# Patient Record
Sex: Male | Born: 1955 | Race: White | Hispanic: No | Marital: Married | Smoking: Current every day smoker
Health system: Southern US, Community
[De-identification: ages and names within clinical notes are randomized; demographics above are authoritative.]

## PROBLEM LIST (undated history)

## (undated) DIAGNOSIS — E079 Disorder of thyroid, unspecified: Secondary | ICD-10-CM

## (undated) DIAGNOSIS — H3322 Serous retinal detachment, left eye: Secondary | ICD-10-CM

## (undated) DIAGNOSIS — IMO0002 Reserved for concepts with insufficient information to code with codable children: Secondary | ICD-10-CM

## (undated) HISTORY — PX: HERNIA REPAIR: SHX51

## (undated) HISTORY — PX: CATARACT EXTRACTION: SUR2

## (undated) HISTORY — PX: WISDOM TOOTH EXTRACTION: SHX21

## (undated) HISTORY — DX: Disorder of thyroid, unspecified: E07.9

---

## 2000-02-06 ENCOUNTER — Encounter: Admission: RE | Admit: 2000-02-06 | Discharge: 2000-02-06 | Payer: Self-pay | Admitting: Endocrinology

## 2000-02-06 ENCOUNTER — Encounter: Payer: Self-pay | Admitting: Endocrinology

## 2000-02-07 ENCOUNTER — Encounter: Payer: Self-pay | Admitting: Endocrinology

## 2000-02-07 ENCOUNTER — Encounter: Admission: RE | Admit: 2000-02-07 | Discharge: 2000-02-07 | Payer: Self-pay | Admitting: Endocrinology

## 2000-03-15 ENCOUNTER — Ambulatory Visit (HOSPITAL_COMMUNITY): Admission: RE | Admit: 2000-03-15 | Discharge: 2000-03-15 | Payer: Self-pay | Admitting: Endocrinology

## 2000-03-15 ENCOUNTER — Encounter: Payer: Self-pay | Admitting: Endocrinology

## 2000-04-08 ENCOUNTER — Encounter: Payer: Self-pay | Admitting: Internal Medicine

## 2000-04-08 ENCOUNTER — Ambulatory Visit (HOSPITAL_COMMUNITY): Admission: RE | Admit: 2000-04-08 | Discharge: 2000-04-08 | Payer: Self-pay | Admitting: Internal Medicine

## 2001-01-30 ENCOUNTER — Encounter: Admission: RE | Admit: 2001-01-30 | Discharge: 2001-01-30 | Payer: Self-pay | Admitting: Endocrinology

## 2001-01-30 ENCOUNTER — Encounter: Payer: Self-pay | Admitting: Endocrinology

## 2004-01-14 ENCOUNTER — Ambulatory Visit (HOSPITAL_COMMUNITY): Admission: RE | Admit: 2004-01-14 | Discharge: 2004-01-14 | Payer: Self-pay | Admitting: *Deleted

## 2004-11-15 ENCOUNTER — Emergency Department (HOSPITAL_COMMUNITY): Admission: EM | Admit: 2004-11-15 | Discharge: 2004-11-15 | Payer: Self-pay | Admitting: Emergency Medicine

## 2004-11-22 ENCOUNTER — Ambulatory Visit (HOSPITAL_COMMUNITY): Admission: RE | Admit: 2004-11-22 | Discharge: 2004-11-22 | Payer: Self-pay | Admitting: Endocrinology

## 2004-12-06 ENCOUNTER — Encounter: Admission: RE | Admit: 2004-12-06 | Discharge: 2004-12-06 | Payer: Self-pay | Admitting: Family Medicine

## 2004-12-11 ENCOUNTER — Encounter
Admission: RE | Admit: 2004-12-11 | Discharge: 2005-03-11 | Payer: Self-pay | Admitting: Physical Medicine and Rehabilitation

## 2005-05-18 ENCOUNTER — Encounter: Admission: RE | Admit: 2005-05-18 | Discharge: 2005-05-18 | Payer: Self-pay | Admitting: Neurology

## 2005-07-24 ENCOUNTER — Encounter: Admission: RE | Admit: 2005-07-24 | Discharge: 2005-10-22 | Payer: Self-pay | Admitting: Internal Medicine

## 2005-12-03 ENCOUNTER — Ambulatory Visit (HOSPITAL_COMMUNITY): Admission: RE | Admit: 2005-12-03 | Discharge: 2005-12-03 | Payer: Self-pay | Admitting: Internal Medicine

## 2006-08-30 ENCOUNTER — Encounter: Admission: RE | Admit: 2006-08-30 | Discharge: 2006-08-30 | Payer: Self-pay | Admitting: Surgery

## 2006-09-09 ENCOUNTER — Ambulatory Visit (HOSPITAL_COMMUNITY): Admission: RE | Admit: 2006-09-09 | Discharge: 2006-09-09 | Payer: Self-pay | Admitting: Surgery

## 2006-09-09 ENCOUNTER — Encounter: Payer: Self-pay | Admitting: Vascular Surgery

## 2006-10-22 ENCOUNTER — Ambulatory Visit (HOSPITAL_COMMUNITY): Admission: RE | Admit: 2006-10-22 | Discharge: 2006-10-22 | Payer: Self-pay | Admitting: Surgery

## 2006-11-28 ENCOUNTER — Encounter: Admission: RE | Admit: 2006-11-28 | Discharge: 2006-11-28 | Payer: Self-pay | Admitting: Surgery

## 2008-03-26 IMAGING — CT CT ABDOMEN W/ CM
2 of 5 series · 17 of 46 positions shown, 19 images · IV contrast (READICAT/WATER & [ID] OMNI 300)
Comparison: 11/22/2004 and 11/15/2004.

ABDOMEN CT WITH CONTRAST

CLINICAL DATA: Left lower quadrant abdominal pain. Fever. Clinical concern for a
hernia.
TECHNIQUE: Multidetector CT imaging of the abdomen and pelvis was performed
following the standard protocol during bolus administration of intravenous
contrast.

Contrast:  100 cc Omnipaque 300

[Series 3: routine abdomen · axial · 0.77mm/px · z∈[-431,-21]mm · 14 of 88 slices shown, 16 images]
[im 5/88  soft-tissue]
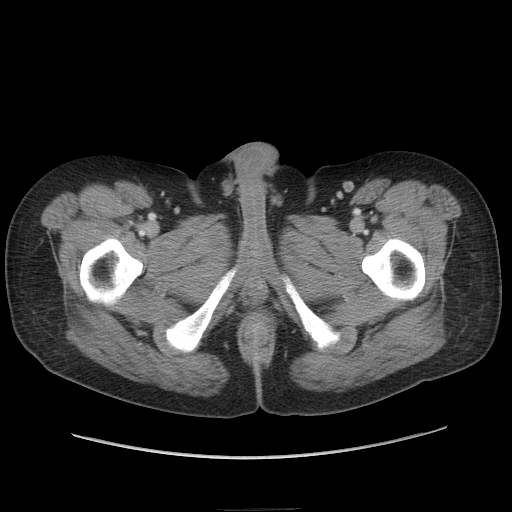
[im 5/88  bone]
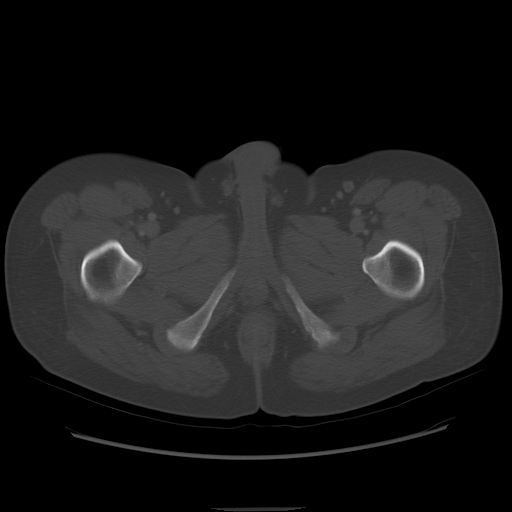
[im 10/88  soft-tissue]
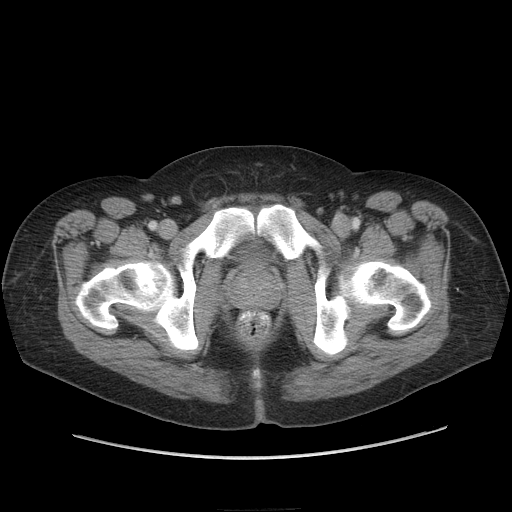
[im 20/88  soft-tissue]
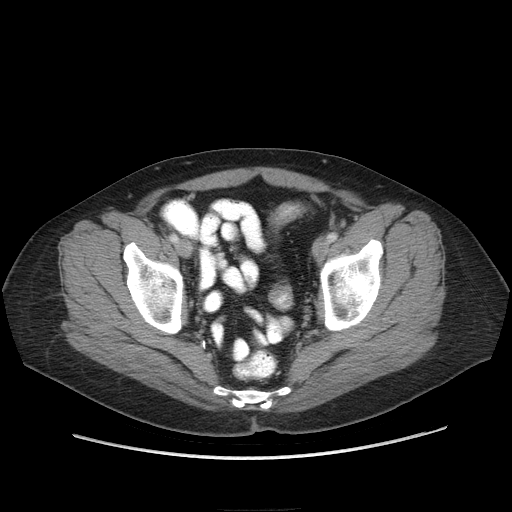
[im 25/88  soft-tissue]
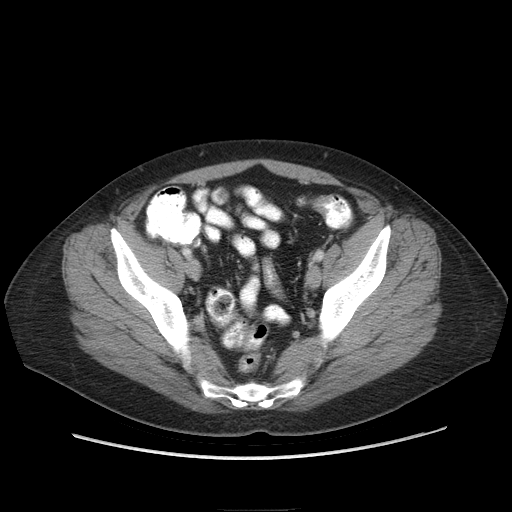
[im 30/88  soft-tissue]
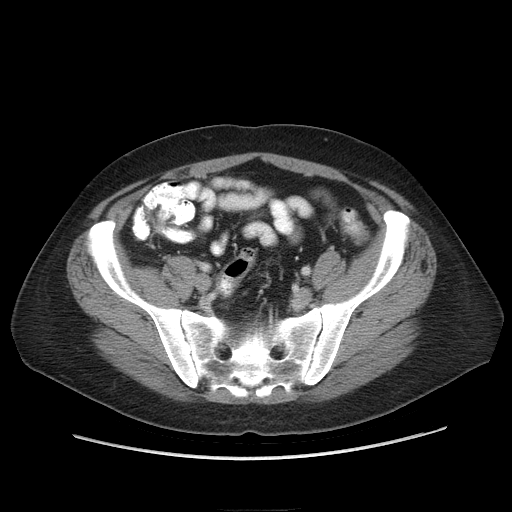
[im 34/88  soft-tissue]
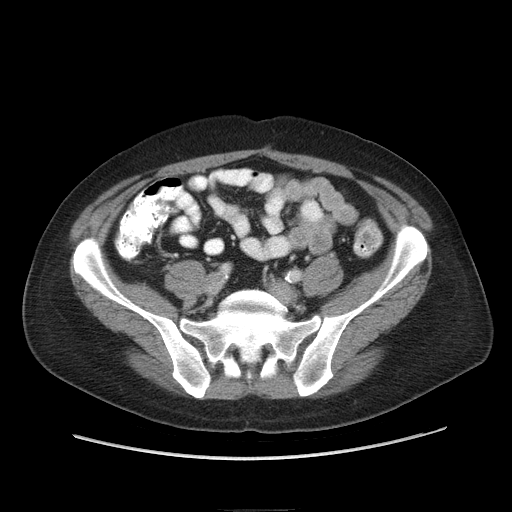
[im 39/88  soft-tissue]
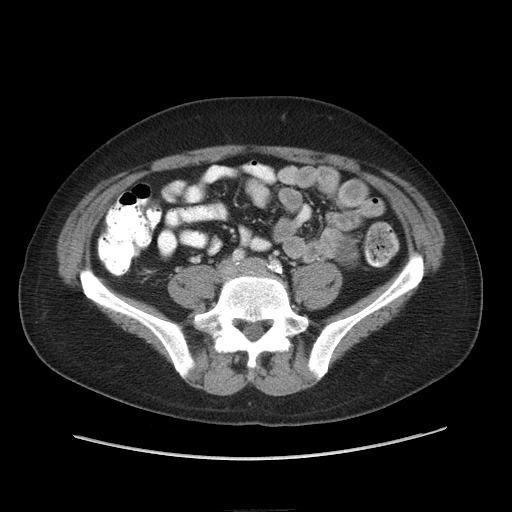
[im 49/88  soft-tissue]
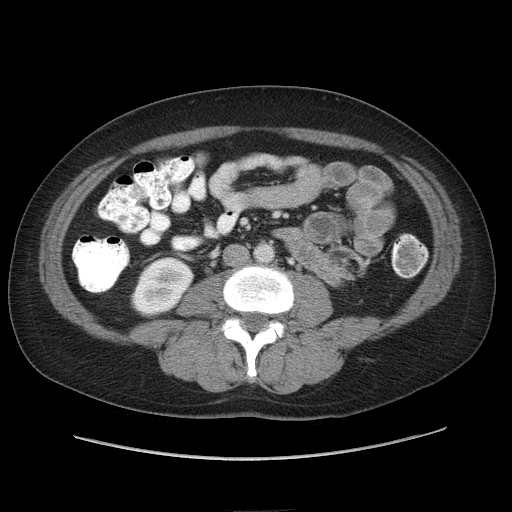
[im 54/88  soft-tissue]
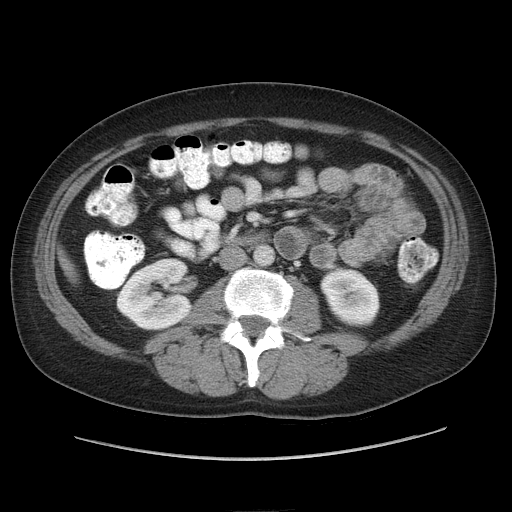
[im 54/88  bone]
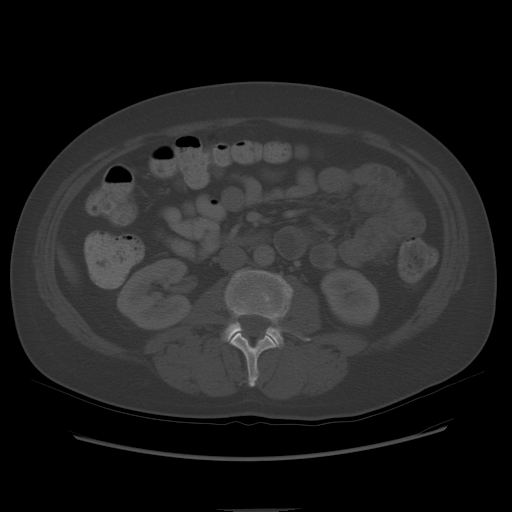
[im 59/88  soft-tissue]
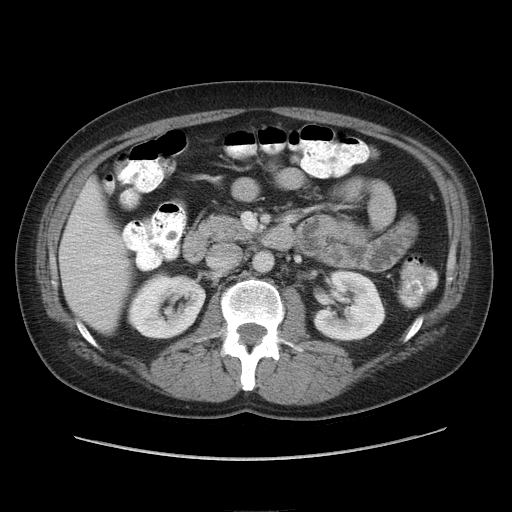
[im 63/88  soft-tissue]
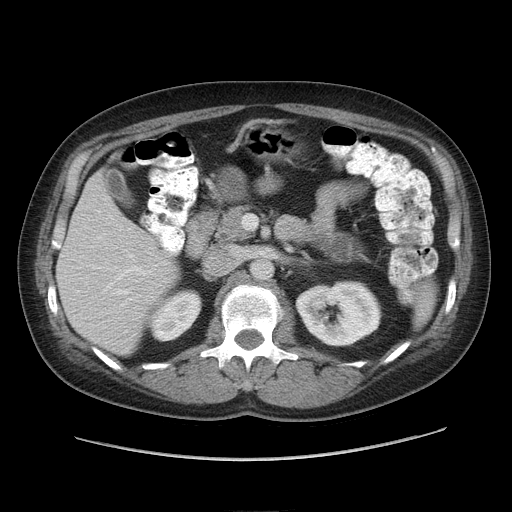
[im 68/88  soft-tissue]
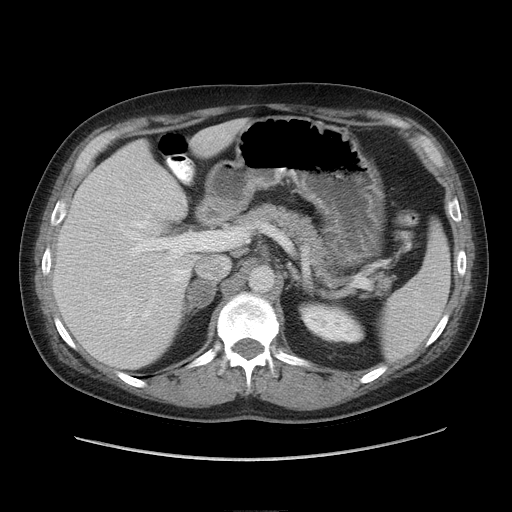
[im 78/88  soft-tissue]
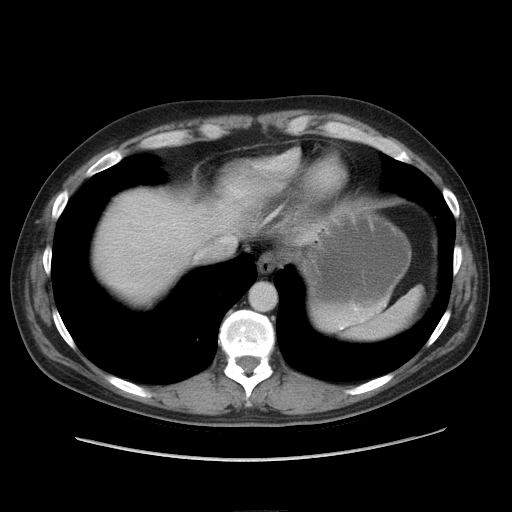
[im 83/88  soft-tissue]
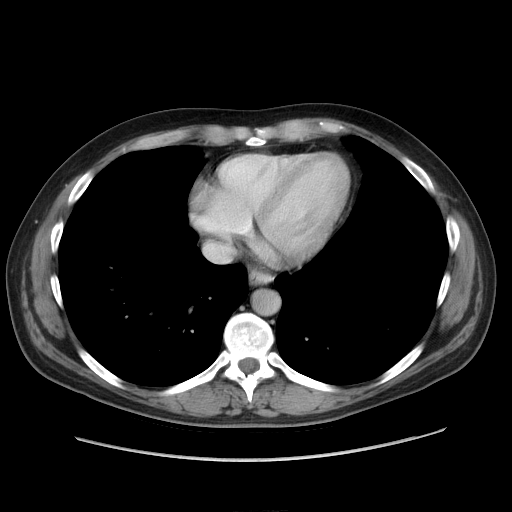

[Series 602: sagittal body · sagittal · 0.96mm/px · 3 of 157 slices shown]
[im 53/157  soft-tissue]
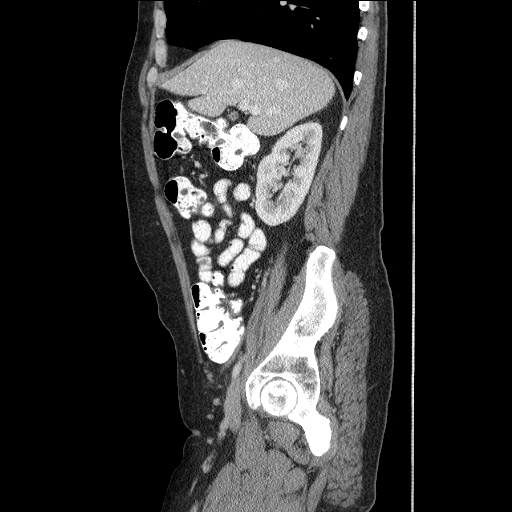
[im 70/157  soft-tissue]
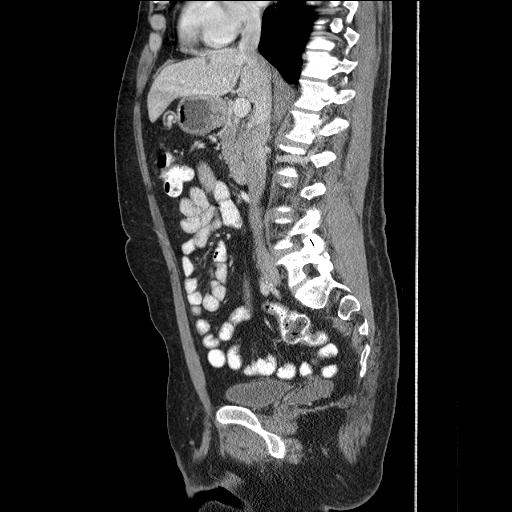
[im 87/157  soft-tissue]
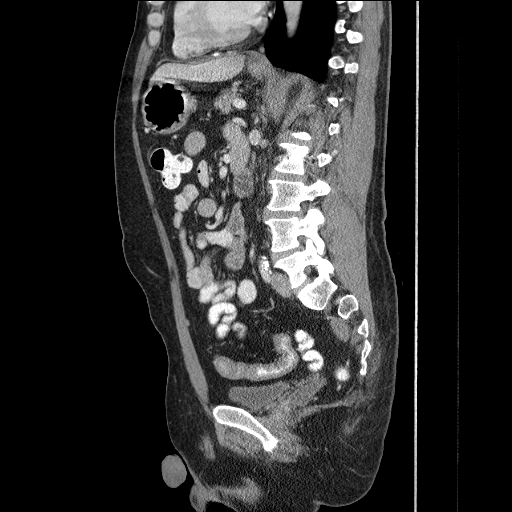

[17 of 46 positions shown; findings below may reference images not displayed]

FINDINGS: The previously demonstrated 2.6 x 2.0 cm right adrenal adenoma is
unchanged in size, shape and density. A small left lobe liver cyst is unchanged.
The previously demonstrated fluid density area in the splenic flexure of the
colon is no longer seen. No hernia seen.

IMPRESSION

Stable right adrenal adenoma. No acute abnormality.

PELVIS CT WITH CONTRAST
FINDINGS: Stable small right inguinal hernia, containing fat. Stable small,
proximal left inguinal hernia, containing fat. No other hernias seen. Mild
sigmoid colon diverticulosis without evidence of diverticulitis. Atheromatous
arterial calcifications.

IMPRESSION

1. Stable small bilateral inguinal hernias, containing fat.
2. Mild sigmoid diverticulosis.

## 2008-06-25 ENCOUNTER — Emergency Department (HOSPITAL_COMMUNITY): Admission: EM | Admit: 2008-06-25 | Discharge: 2008-06-25 | Payer: Self-pay | Admitting: Emergency Medicine

## 2011-01-19 NOTE — Op Note (Signed)
NAME:  Randall Kline, Randall Kline               ACCOUNT NO.:  [id number]   MEDICAL RECORD NO.:  [id number]          PATIENT TYPE:  AMB   LOCATION:  DAY                          FACILITY:  [facility]   PHYSICIAN:  Thomas A. Cornett, M.D.DATE OF BIRTH:  1956/06/05   DATE OF PROCEDURE:  10/22/2006  DATE OF DISCHARGE:                               OPERATIVE REPORT   PREOPERATIVE DIAGNOSIS:  Bilateral inguinal hernia.   POSTOPERATIVE DIAGNOSIS:  Bilateral inguinal hernia.   PROCEDURE:  Laparoscopic preperitoneal bilateral inguinal hernia repair  with Ultrapro mesh.   SURGEON:  Harriette Bouillon, MD   ANESTHESIA:  General endotracheal anesthesia with 10 mL of 0.25%  Sensorcaine.   ESTIMATED BLOOD LOSS:  20 mL   SPECIMENS:  None.   INDICATIONS FOR PROCEDURE:  The patient is a 55 year old male with  chronic lower abdominal pain.  CT showed small bilateral inguinal  hernia, and he presents today for repair of these due to discomfort.  After receiving informed consent, the patient agreed to proceed.   DESCRIPTION OF PROCEDURE:  The patient was brought to the operating  room, placed supine.  After induction of general anesthesia, both arms  were tucked.  Foley catheter was placed.  Incision was made just below  his umbilicus.  Dissection was carried down to his rectus abdominis  muscles.  I opened the anterior sheath on the right side and pushed the  muscle laterally.  I placed my finger posterior to that and created a  small space.  I did the same with the left rectus abdominis muscle as  well.  I then made a small incision to connect the 2 points together  anteriorly.  I then placed a space maker balloon port into the  preperitoneal space carefully; I advanced this.  Under direct vision,  the preperitoneal space was opened with good hemostasis.  I then removed  the balloon and insufflated the space with 12 mmHg CO2 and placed the  laparoscope.  Two 5 mm ports were then placed on the right and left of  the main trocar.  The right side dissected away well.  The direct  triangle was intact bilaterally.  The balloon dissected well down below  the pubic symphysis toward the obturator canals.  There is good  hemostasis.  The bladder was uninjured.  Next attention was turned to  the right inguinal canal.  I was able to dissect out a small indirect  hernia away from the cord and reduce this back in the preperitoneal  space.  On the left side, a small indirect hernia was also identified.  It was dissected away into the preperitoneal space.  I then used 2  pieces of 6 inch x 6 inch Ultrapro mesh.  I cut a small slit inferiorly  and slid this down below the pubic tubercle just anterior to the bladder  to aid in fixation.  I was then able to situate the mesh to cover the  defect bilaterally.  I used 2 tacks on the right side and used these  medial superior to keep the mesh from sliding around when I  was  positioning it.  A second piece of mesh was used to cover the left  inguinal canal as well, and 2 tacks were made, both superior and medial.  No tacks were placed laterally.  I did not elect to place tacks in  Cooper's ligament since the mesh was slid down well below the pubic  tubercle and seen to be very well fixed there.  At this point, I  desufflated using graspers to hold the mesh in place so it would lay  flat in the preperitoneal space.  Hemostasis was excellent.  The ports  were subsequently removed.  These were then passed off the field.  A 4-0  Monocryl was used to close the skin incisions.  Sterile dressings were applied.  The catheter was removed.  The patient  was then awoke and taken to recovery in satisfactory condition.  All  final counts of sponge, needle and instruments were found to be correct  at this portion of the case.      Thomas A. Cornett, M.D.  Electronically Signed     TAC/MEDQ  D:  10/22/2006  T:  10/22/2006  Job:  [id number]   cc:   Dr. Bess Kinds

## 2011-06-04 LAB — COMPREHENSIVE METABOLIC PANEL
ALT: 24
Alkaline Phosphatase: 94
BUN: 8
CO2: 26
Chloride: 105
Creatinine, Ser: 0.91
GFR calc Af Amer: >60
GFR calc non Af Amer: >60
Potassium: 3.9
Total Protein: 6.6

## 2011-06-04 LAB — URINALYSIS, ROUTINE W REFLEX MICROSCOPIC
Glucose, UA: NEGATIVE
Ketones, ur: NEGATIVE
Leukocytes, UA: NEGATIVE
pH: 6

## 2011-06-04 LAB — CBC
Hemoglobin: 16.1
MCHC: 33.4
Platelets: 184
RDW: 13.8

## 2011-06-04 LAB — POCT CARDIAC MARKERS
CKMB, poc: 1
CKMB, poc: <1 — ABNORMAL LOW
CKMB, poc: <1 — ABNORMAL LOW
Myoglobin, poc: 218
Myoglobin, poc: 286
Myoglobin, poc: 416
Troponin i, poc: <0.05

## 2011-06-04 LAB — DIFFERENTIAL
Eosinophils Absolute: 0
Eosinophils Relative: 0
Neutrophils Relative %: 88 — ABNORMAL HIGH

## 2011-06-04 LAB — URINE MICROSCOPIC-ADD ON

## 2011-10-16 ENCOUNTER — Encounter (INDEPENDENT_AMBULATORY_CARE_PROVIDER_SITE_OTHER): Payer: Self-pay | Admitting: Ophthalmology

## 2011-10-16 DIAGNOSIS — H43819 Vitreous degeneration, unspecified eye: Secondary | ICD-10-CM

## 2011-10-16 DIAGNOSIS — H33009 Unspecified retinal detachment with retinal break, unspecified eye: Secondary | ICD-10-CM

## 2011-10-19 NOTE — H&P (Signed)
Randall Kline is an 56 y.o. male.   Chief Complaint:Rhegmatogenous Retinal Detachment Left Eye  HPI: 56 yo man with 3 week history of severe vision loss left eye  PMH  Multiple benign tumors  History of cataract surgery OS  No family history on file. Social History: Smokes one pack per day.  Denies alcohol use or drug use.  Allergies: Allergies not on file  No current facility-administered medications on file as of .   No current outpatient prescriptions on file as of .    Review of systems otherwise negative  There were no vitals taken for this visit.  Physical exam: Mental status: oriented x3. Eyes: See eye exam associated with this surgery on this date scanned in by scanning center.  Look in Media Ears, Nose, Throat: within normal limits Neck: Within Normal limits General: within normal limits Chest: Within normal limits Breast: deferred Heart: Within normal limits Abdomen: Within normal limits GU: deferred Extremities: within normal limits Skin: within normal limits  Assessment/Plan Rhegmatogenous Retinal Detachment Plan: To [facility] for Repair of retinal detachment  Sherrie George 10/19/2011, 7:23 AM

## 2011-10-30 ENCOUNTER — Encounter (HOSPITAL_COMMUNITY): Admission: RE | Payer: Self-pay | Source: Ambulatory Visit

## 2011-10-30 ENCOUNTER — Ambulatory Visit (HOSPITAL_COMMUNITY): Admission: RE | Admit: 2011-10-30 | Payer: Self-pay | Source: Ambulatory Visit | Admitting: Ophthalmology

## 2011-10-30 SURGERY — SCLERAL BUCKLE
Anesthesia: General | Laterality: Left

## 2012-09-05 ENCOUNTER — Ambulatory Visit (INDEPENDENT_AMBULATORY_CARE_PROVIDER_SITE_OTHER): Payer: Self-pay | Admitting: Surgery

## 2012-09-12 ENCOUNTER — Ambulatory Visit (INDEPENDENT_AMBULATORY_CARE_PROVIDER_SITE_OTHER): Payer: Self-pay | Admitting: Surgery

## 2012-09-19 ENCOUNTER — Ambulatory Visit (INDEPENDENT_AMBULATORY_CARE_PROVIDER_SITE_OTHER): Payer: Self-pay | Admitting: Surgery

## 2012-09-19 ENCOUNTER — Encounter (INDEPENDENT_AMBULATORY_CARE_PROVIDER_SITE_OTHER): Payer: Self-pay | Admitting: Surgery

## 2012-09-19 VITALS — BP 116/72 | Temp 97.8°F | Ht 66.5 in | Wt 146.8 lb

## 2012-09-19 DIAGNOSIS — R109 Unspecified abdominal pain: Secondary | ICD-10-CM

## 2012-09-19 NOTE — Patient Instructions (Signed)
Will refer to urology and order blood work and x rays to evaluate abdominal pain.

## 2012-09-19 NOTE — Progress Notes (Signed)
Patient ID: Randall Kline, male   DOB: 1956-07-19, 57 y.o.   MRN: [id number]  No chief complaint on file.   HPI Randall Kline is a 57 y.o. male.  Patient presents in followup due to bilateral inguinal hernia repair from 2008 was done laparoscopically. The last few months he is developing pain in his lower abdomen and was concerned for recurrent hernia. He relates low-grade fever, change the consistency of his ejaculate, burning when he urinates and some fullness and difficulty starting his urine stream stopping it. No nausea or vomiting. No change in bowel function. He has not had a recent workup for prostate issues. The pain is constant and described as tall and aching on both sides of his lower abdominal wall down through his inguinal canals. Lifting pushing or pulling does not seem to exacerbate this. He recently lost his son and has been under some stress from that. HPI  Past Medical History  Diagnosis Date  . Thyroid disease     Past Surgical History  Procedure Date  . Hernia repair   . Wisdom tooth extraction     Family History  Problem Relation Age of Onset  . Kidney disease Father     Social History History  Substance Use Topics  . Smoking status: Current Every Day Smoker  . Smokeless tobacco: Not on file  . Alcohol Use: No    No Known Allergies  Current Outpatient Prescriptions  Medication Sig Dispense Refill  . lansoprazole (PREVACID) 15 MG capsule Take 15 mg by mouth daily.      . OXYCODONE HCL PO Take 30 mg by mouth.        Review of Systems Review of Systems  Constitutional: Positive for appetite change and fatigue.  HENT: Negative.   Eyes: Negative.   Respiratory: Negative.   Cardiovascular: Negative.   Gastrointestinal: Positive for abdominal pain.  Genitourinary: Positive for dysuria, urgency, frequency and difficulty urinating. Negative for flank pain.  Musculoskeletal: Positive for arthralgias.  Neurological: Negative.   Hematological: Negative.    Psychiatric/Behavioral: Negative.     Blood pressure 116/72, temperature 97.8 F (36.6 C), height 5' 6.5" (1.689 m), weight 146 lb 12.8 oz (66.588 kg).  Physical Exam Physical Exam  Constitutional: He is oriented to person, place, and time. He appears well-developed and well-nourished.  HENT:  Head: Normocephalic and atraumatic.  Eyes: EOM are normal. Pupils are equal, round, and reactive to light.  Neck: Normal range of motion. Neck supple.  Pulmonary/Chest: Effort normal.  Abdominal: Soft. Bowel sounds are normal. No hernia. Hernia confirmed negative in the right inguinal area and confirmed negative in the left inguinal area.    Musculoskeletal: Normal range of motion.  Neurological: He is alert and oriented to person, place, and time.  Skin: Skin is warm and dry.  Psychiatric: He has a normal mood and affect. His behavior is normal. Judgment and thought content normal.      Assessment    Abdominal and pelvic pain with difficulty in urination and change in the color and consistency of his ejaculate with chronic low-grade fever.  No evidence of inguinal hernia.    Plan    No evidence of recurrent inguinal hernia on exam. I will refer to urology for more formal evaluation of his urinary issues. Order CT scan abdomen pelvis to further evaluate his lower abdominal pain as well as check a CBC, urinalysis, CMET and thyroid function since he gives a history of thyroid disease in the past. Return to  clinic as needed.       Jaydin Jalomo A. 09/19/2012, 10:34 AM

## 2012-09-22 ENCOUNTER — Other Ambulatory Visit (INDEPENDENT_AMBULATORY_CARE_PROVIDER_SITE_OTHER): Payer: Self-pay | Admitting: General Surgery

## 2012-09-22 ENCOUNTER — Telehealth (INDEPENDENT_AMBULATORY_CARE_PROVIDER_SITE_OTHER): Payer: Self-pay | Admitting: General Surgery

## 2012-09-22 DIAGNOSIS — R109 Unspecified abdominal pain: Secondary | ICD-10-CM

## 2012-09-22 NOTE — Telephone Encounter (Signed)
Left message for patient to call office to verify insurance coverage for Feb so test and appt can be made

## 2012-10-07 ENCOUNTER — Telehealth (INDEPENDENT_AMBULATORY_CARE_PROVIDER_SITE_OTHER): Payer: Self-pay | Admitting: General Surgery

## 2012-10-07 NOTE — Telephone Encounter (Signed)
Let patient wife know insurance card was received this am will moved forward with scheduling ct abd and pelvis and  Urology appt

## 2012-10-07 NOTE — Telephone Encounter (Signed)
Patient called back and he is faxing a copy of his new insurance coverage card to [phone] attn: Darl Pikes.

## 2012-10-07 NOTE — Telephone Encounter (Signed)
Spoke with patient he has appt with [facility] 10/08/12 @230PM  with Dr Retta Diones. He also was instructed to pick up contrast tomorrow from [facility] imaging . Ct abd and pelvis with contrast will be done on 10/10/12 @11 :30am

## 2012-10-10 ENCOUNTER — Other Ambulatory Visit: Payer: Self-pay

## 2014-02-11 ENCOUNTER — Encounter (HOSPITAL_COMMUNITY): Payer: Self-pay | Admitting: Emergency Medicine

## 2014-02-11 ENCOUNTER — Emergency Department (HOSPITAL_COMMUNITY)
Admission: EM | Admit: 2014-02-11 | Discharge: 2014-02-11 | Disposition: A | Discharge status: Home / Self Care | Payer: Self-pay | Attending: Emergency Medicine | Admitting: Emergency Medicine

## 2014-02-11 DIAGNOSIS — M542 Cervicalgia: Secondary | ICD-10-CM | POA: Insufficient documentation

## 2014-02-11 DIAGNOSIS — M549 Dorsalgia, unspecified: Secondary | ICD-10-CM

## 2014-02-11 DIAGNOSIS — Z862 Personal history of diseases of the blood and blood-forming organs and certain disorders involving the immune mechanism: Secondary | ICD-10-CM | POA: Insufficient documentation

## 2014-02-11 DIAGNOSIS — F17200 Nicotine dependence, unspecified, uncomplicated: Secondary | ICD-10-CM | POA: Insufficient documentation

## 2014-02-11 DIAGNOSIS — M545 Low back pain, unspecified: Secondary | ICD-10-CM | POA: Insufficient documentation

## 2014-02-11 DIAGNOSIS — Z8639 Personal history of other endocrine, nutritional and metabolic disease: Secondary | ICD-10-CM | POA: Insufficient documentation

## 2014-02-11 DIAGNOSIS — G8929 Other chronic pain: Secondary | ICD-10-CM | POA: Insufficient documentation

## 2014-02-11 DIAGNOSIS — Z79899 Other long term (current) drug therapy: Secondary | ICD-10-CM | POA: Insufficient documentation

## 2014-02-11 MED ORDER — OXYCODONE HCL 10 MG PO TABS: 10.0000 mg | ORAL_TABLET | Freq: Four times a day (QID) | ORAL | Status: DC | PRN

## 2014-02-11 NOTE — Progress Notes (Signed)
[facility] provided pt with a list of primary care resources and a [facility] [employer] Card application to help patient establish primary care.  °

## 2014-02-11 NOTE — Discharge Instructions (Signed)
Take the prescribed medication as directed. Follow-up with your primary care physician regarding pain management. Return to the ED for new or worsening symptoms.

## 2014-02-11 NOTE — ED Provider Notes (Signed)
CSN: [id number]     Arrival date & time 02/11/14  1245 History   First MD Initiated Contact with Patient 02/11/14 1005     Chief Complaint  Patient presents with  . Back Pain  . Neck Pain     (Consider location/radiation/quality/duration/timing/severity/associated sxs/prior Treatment) Patient is a 58 y.o. male presenting with back pain and neck pain. The history is provided by the patient and medical records.  Back Pain Neck Pain  This is a 58 year old male presenting to the ED for chronic neck and low back pain for the past 10 years. Patient recently had to switch to a new primary care physician, who is in the process of getting him establish with a pain management clinic. He states he is usually been taking oxycodone for pain management but ran out yesterday.  He denies any new injury, trauma, or falls. His pain is unchanged, but is now well controlled with over-the-counter medications.  Denies any numbness or paresthesias of lower extremities. No loss of bowel or bladder control. No weakness. Patient ambulates assisted with a cane at baseline.  Past Medical History  Diagnosis Date  . Thyroid disease    Past Surgical History  Procedure Laterality Date  . Hernia repair    . Wisdom tooth extraction     Family History  Problem Relation Age of Onset  . Kidney disease Father    History  Substance Use Topics  . Smoking status: Current Every Day Smoker  . Smokeless tobacco: Not on file  . Alcohol Use: No    Review of Systems  Musculoskeletal: Positive for back pain and neck pain.  All other systems reviewed and are negative.     Allergies  Review of patient's allergies indicates no known allergies.  Home Medications   Prior to Admission medications   Medication Sig Start Date End Date Taking? Authorizing Provider  lansoprazole (PREVACID) 15 MG capsule Take 15 mg by mouth daily.    Historical Provider, MD  OXYCODONE HCL PO Take 30 mg by mouth.    Historical Provider, MD    BP 143/78  Pulse 79  Temp(Src) 99 F (37.2 C) (Oral)  Resp 20  SpO2 100%  Physical Exam  Nursing note and vitals reviewed. Constitutional: He is oriented to person, place, and time. He appears well-developed and well-nourished.  HENT:  Head: Normocephalic and atraumatic.  Mouth/Throat: Oropharynx is clear and moist.  Eyes: Conjunctivae and EOM are normal. Pupils are equal, round, and reactive to light.  Neck: Normal range of motion.  Cardiovascular: Normal rate, regular rhythm and normal heart sounds.   Pulmonary/Chest: Effort normal and breath sounds normal.  Musculoskeletal: Normal range of motion.       Cervical back: He exhibits tenderness, bony tenderness and pain. He exhibits no deformity.       Lumbar back: He exhibits tenderness, bony tenderness and pain. He exhibits no deformity.  Midline tenderness and pain of cervical and lumbar spine without deformities; normal strength and sensation of BUE and BLE; baseline gait assisted with cane  Neurological: He is alert and oriented to person, place, and time.  Skin: Skin is warm and dry.  Psychiatric: He has a normal mood and affect.    ED Course  Procedures (including critical care time) Labs Review Labs Reviewed - No data to display  Imaging Review No results found.   EKG Interpretation None      MDM   Final diagnoses:  Chronic back pain  Chronic neck pain  Chronic neck and back pain.  No red flag sx concerning for central cord syndrome, cauda equina, or other neurovascular compromise.  Pt will be given short supply of oxycodone.  He will FU with his PCP and/or pain management.  Discussed plan with patient, he/she acknowledged understanding and agreed with plan of care.  Return precautions given for new or worsening symptoms.  Garlon HatchetLisa M Rhythm Wigfall, PA-C 02/11/14 1104

## 2014-02-11 NOTE — ED Notes (Signed)
Pt c/o back and neck pain due to DDD since 2005.

## 2014-02-12 NOTE — ED Provider Notes (Signed)
Medical screening examination/treatment/procedure(s) were performed by non-physician practitioner and as supervising physician I was immediately available for consultation/collaboration.   Kadeidra Coryell J. Loretta Doutt, MD 02/12/14 1548 

## 2014-02-17 ENCOUNTER — Encounter (HOSPITAL_COMMUNITY): Payer: Self-pay | Admitting: Emergency Medicine

## 2014-02-17 ENCOUNTER — Emergency Department (HOSPITAL_COMMUNITY)
Admission: EM | Admit: 2014-02-17 | Discharge: 2014-02-17 | Disposition: A | Discharge status: Home / Self Care | Payer: Self-pay | Attending: Emergency Medicine | Admitting: Emergency Medicine

## 2014-02-17 DIAGNOSIS — M545 Low back pain, unspecified: Secondary | ICD-10-CM | POA: Insufficient documentation

## 2014-02-17 DIAGNOSIS — Z862 Personal history of diseases of the blood and blood-forming organs and certain disorders involving the immune mechanism: Secondary | ICD-10-CM | POA: Insufficient documentation

## 2014-02-17 DIAGNOSIS — M542 Cervicalgia: Secondary | ICD-10-CM | POA: Insufficient documentation

## 2014-02-17 DIAGNOSIS — M549 Dorsalgia, unspecified: Secondary | ICD-10-CM

## 2014-02-17 DIAGNOSIS — Z8639 Personal history of other endocrine, nutritional and metabolic disease: Secondary | ICD-10-CM | POA: Insufficient documentation

## 2014-02-17 DIAGNOSIS — G8929 Other chronic pain: Secondary | ICD-10-CM | POA: Insufficient documentation

## 2014-02-17 DIAGNOSIS — F17200 Nicotine dependence, unspecified, uncomplicated: Secondary | ICD-10-CM | POA: Insufficient documentation

## 2014-02-17 DIAGNOSIS — Z79899 Other long term (current) drug therapy: Secondary | ICD-10-CM | POA: Insufficient documentation

## 2014-02-17 HISTORY — DX: Serous retinal detachment, left eye: H33.22

## 2014-02-17 HISTORY — DX: Reserved for concepts with insufficient information to code with codable children: IMO0002

## 2014-02-17 MED ORDER — OXYCODONE-ACETAMINOPHEN 5-325 MG PO TABS
2.0000 | ORAL_TABLET | Freq: Once | ORAL | Status: AC
Start: 2014-02-17 — End: 2014-02-17
  Administered 2014-02-17: 2 via ORAL
  Filled 2014-02-17: qty 2

## 2014-02-17 MED ORDER — NAPROXEN 500 MG PO TABS
500.0000 mg | ORAL_TABLET | Freq: Two times a day (BID) | ORAL | Status: AC
Start: 2014-02-17 — End: ?

## 2014-02-17 MED ORDER — CYCLOBENZAPRINE HCL 5 MG PO TABS: 5.0000 mg | ORAL_TABLET | Freq: Three times a day (TID) | ORAL | Status: AC | PRN

## 2014-02-17 NOTE — ED Provider Notes (Signed)
CSN: [id number]     Arrival date & time 02/17/14  [id number] History   First MD Initiated Contact with Patient 02/17/14 [id number]     Chief Complaint  Patient presents with  . Back Pain    HPI  Randall Kline is a 58 y.o. male with a PMH of DDD, thyroid disease, and detached retina who presents to the ED for evaluation of back pain. History was provided by the patient. Patient states he has chronic unchanged neck and back pain for several years. Has had imaging in the past (brought results to ED). Pain is located in his left cervical and lumbar paraspinal regions. No new injuries or trauma. Pain is a constant aching pain which is worse with movement. He previously was on oxycodone for his back pain but his PCP stopped prescribing this due to a positive drug screen. He was seen in the ED on 02/11/14 for his back pain and was prescribed oxycodone (20 tablets). He has since ran out and is trying to get into a pain management clinic. Has been taking Aleve with no relief. No hx of IV drug use or cancer. No weakness, dysuria, loss of bowel/bladder function, abdominal pain, numbness/tingling, loss of sensation, fever, chills, change in appetite/activity, or other concerns. Patient uses a cane at baseline.    Past Medical History  Diagnosis Date  . Thyroid disease   . DDD (degenerative disc disease)   . Detached retina, left    Past Surgical History  Procedure Laterality Date  . Hernia repair    . Wisdom tooth extraction    . Cataract extraction Bilateral    Family History  Problem Relation Age of Onset  . Kidney disease Father    History  Substance Use Topics  . Smoking status: Current Every Day Smoker -- 0.50 packs/day    Types: Cigarettes  . Smokeless tobacco: Not on file  . Alcohol Use: No    Review of Systems  Constitutional: Negative for fever, chills, activity change, appetite change and fatigue.  HENT: Negative for congestion, rhinorrhea and sore throat.   Respiratory: Negative for cough  and shortness of breath.   Cardiovascular: Negative for chest pain.  Gastrointestinal: Negative for nausea, vomiting and abdominal pain.  Genitourinary: Negative for dysuria, decreased urine volume and difficulty urinating.  Musculoskeletal: Positive for back pain and neck pain. Negative for gait problem.  Skin: Negative for wound.  Neurological: Negative for dizziness, weakness, light-headedness and headaches.    Allergies  Review of patient's allergies indicates no known allergies.  Home Medications   Prior to Admission medications   Medication Sig Start Date End Date Taking? Authorizing Provider  Oxycodone HCl 10 MG TABS Take 20 mg by mouth 4 (four) times daily.   Yes Historical Provider, MD   BP 148/79  Pulse 65  Temp(Src) 97.9 F (36.6 C) (Oral)  Resp 18  Ht 5' 6.5" (1.689 m)  Wt 162 lb (73.483 kg)  BMI 25.76 kg/m2  SpO2 100%  Filed Vitals:   02/17/14 0335  BP: 148/79  Pulse: 65  Temp: 97.9 F (36.6 C)  TempSrc: Oral  Resp: 18  Height: 5' 6.5" (1.689 m)  Weight: 162 lb (73.483 kg)  SpO2: 100%    Physical Exam  Nursing note and vitals reviewed. Constitutional: He is oriented to person, place, and time. He appears well-developed and well-nourished. No distress.  HENT:  Head: Normocephalic and atraumatic.  Right Ear: External ear normal.  Left Ear: External ear normal.  Mouth/Throat: Oropharynx  is clear and moist.  Eyes: Conjunctivae are normal. Right eye exhibits no discharge. Left eye exhibits no discharge.  Neck: Normal range of motion. Neck supple.  Tenderness to palpation to the left paraspinal muscles. No cervical spinal tenderness. No cervical lymphadenopathy. No nuchal rigidity.   Cardiovascular: Normal rate, regular rhythm and normal heart sounds.  Exam reveals no gallop and no friction rub.   No murmur heard. Dorsalis pedis pulses present and equal bilaterally  Pulmonary/Chest: Effort normal and breath sounds normal. No respiratory distress. He has  no wheezes. He has no rales. He exhibits no tenderness.  Abdominal: Soft. He exhibits no distension. There is no tenderness.  Musculoskeletal: Normal range of motion. He exhibits tenderness. He exhibits no edema.       Arms: Tenderness to palpation to the left lumbar paraspinal muscles diffusely. Strength 5/5 in the upper and lower extremities bilaterally. Patient able to ambulate without difficulty or ataxia (uses cane).   Neurological: He is alert and oriented to person, place, and time.  Patellar reflexes intact bilaterally. Gross sensation intact in the LE bilaterally  Skin: Skin is warm and dry. He is not diaphoretic.     ED Course  Procedures (including critical care time) Labs Review Labs Reviewed - No data to display  Imaging Review No results found.   EKG Interpretation None      MDM   Randall Kline is a 58 y.o. male with a PMH of DDD, thyroid disease, and detached retina who presents to the ED for evaluation of back pain. Neck and back pain chronic and unchanged. Likely from chronic degenerative changes. No hx of recent trauma or injuries. No warning signs or symptoms of back pain including loss of bowel or bladder control, night sweats, waking from sleep with back pain, unexplained fevers or weight loss, history of cancer, or IV drug use. No concern for cauda equina, epidural abscess, or other serious/life threatening cause of back pain. Patient neurovascularly intact with no focal neurological deficits. Vital signs stable. Patient afebrile and non-toxic in appearance. Patient given Percocet during his last ED visit on 02/11/14. Patient given referral to pain management. Patient accepted flexeril and naprosyn for other pain management. Return precautions, discharge instructions, and follow-up was discussed with the patient before discharge.     New Prescriptions   CYCLOBENZAPRINE (FLEXERIL) 5 MG TABLET    Take 1 tablet (5 mg total) by mouth 3 (three) times daily as needed  for muscle spasms.   NAPROXEN (NAPROSYN) 500 MG TABLET    Take 1 tablet (500 mg total) by mouth 2 (two) times daily with a meal.     Final impressions: 1. Chronic neck and back pain      Thomasenia SalesJessica Katlin Phylicia Mcgaugh PA-C            Jillyn LedgerJessica K Omarion Minnehan, [place] JerseyPA-C 02/17/14 [phone]

## 2014-02-17 NOTE — Discharge Instructions (Signed)
Take naprosyn with food twice daily  Take flexeril at night - Please be careful with this medication.  It can cause drowsiness.  Use caution while driving, operating machinery, drinking alcohol, or any other activities that may impair your physical or mental abilities.   Return to the emergency department if you develop any changing/worsening condition, loss of bowel/bladder function, abdominal or chest pain, weakness, or any other concerns (please read additional information regarding your condition below)    Back Pain, Adult Low back pain is very common. About 1 in 5 people have back pain.The cause of low back pain is rarely dangerous. The pain often gets better over time.About half of people with a sudden onset of back pain feel better in just 2 weeks. About 8 in 10 people feel better by 6 weeks.  CAUSES Some common causes of back pain include:  Strain of the muscles or ligaments supporting the spine.  Wear and tear (degeneration) of the spinal discs.  Arthritis.  Direct injury to the back. DIAGNOSIS Most of the time, the direct cause of low back pain is not known.However, back pain can be treated effectively even when the exact cause of the pain is unknown.Answering your caregiver's questions about your overall health and symptoms is one of the most accurate ways to make sure the cause of your pain is not dangerous. If your caregiver needs more information, he or she may order lab work or imaging tests (X-rays or MRIs).However, even if imaging tests show changes in your back, this usually does not require surgery. HOME CARE INSTRUCTIONS For many people, back pain returns.Since low back pain is rarely dangerous, it is often a condition that people can learn to Advanced Pain Management their own.   Remain active. It is stressful on the back to sit or stand in one place. Do not sit, drive, or stand in one place for more than 30 minutes at a time. Take short walks on level surfaces as soon as pain  allows.Try to increase the length of time you walk each day.  Do not stay in bed.Resting more than 1 or 2 days can delay your recovery.  Do not avoid exercise or work.Your body is made to move.It is not dangerous to be active, even though your back may hurt.Your back will likely heal faster if you return to being active before your pain is gone.  Pay attention to your body when you bend and lift. Many people have less discomfortwhen lifting if they bend their knees, keep the load close to their bodies,and avoid twisting. Often, the most comfortable positions are those that put less stress on your recovering back.  Find a comfortable position to sleep. Use a firm mattress and lie on your side with your knees slightly bent. If you lie on your back, put a pillow under your knees.  Only take over-the-counter or prescription medicines as directed by your caregiver. Over-the-counter medicines to reduce pain and inflammation are often the most helpful.Your caregiver may prescribe muscle relaxant drugs.These medicines help dull your pain so you can more quickly return to your normal activities and healthy exercise.  Put ice on the injured area.  Put ice in a plastic bag.  Place a towel between your skin and the bag.  Leave the ice on for 15-20 minutes, 03-04 times a day for the first 2 to 3 days. After that, ice and heat may be alternated to reduce pain and spasms.  Ask your caregiver about trying back exercises and gentle  massage. This may be of some benefit.  Avoid feeling anxious or stressed.Stress increases muscle tension and can worsen back pain.It is important to recognize when you are anxious or stressed and learn ways to manage it.Exercise is a great option. SEEK MEDICAL CARE IF:  You have pain that is not relieved with rest or medicine.  You have pain that does not improve in 1 week.  You have new symptoms.  You are generally not feeling well. SEEK IMMEDIATE MEDICAL CARE  IF:   You have pain that radiates from your back into your legs.  You develop new bowel or bladder control problems.  You have unusual weakness or numbness in your arms or legs.  You develop nausea or vomiting.  You develop abdominal pain.  You feel faint. Document Released: 08/20/2005 Document Revised: 02/19/2012 Document Reviewed: 01/08/2011 [facility] Patient Information 2015 [place], [place]. This information is not intended to replace advice given to you by your health care provider. Make sure you discuss any questions you have with your health care provider.   Emergency Department Resource Guide 1) Find a Doctor and Pay Out of Pocket Although you won't have to find out who is covered by your insurance plan, it is a good idea to ask around and get recommendations. You will then need to call the office and see if the doctor you have chosen will accept you as a new patient and what types of options they offer for patients who are self-pay. Some doctors offer discounts or will set up payment plans for their patients who do not have insurance, but you will need to ask so you aren't surprised when you get to your appointment.  2) Contact Your Local Health Department Not all health departments have doctors that can see patients for sick visits, but many do, so it is worth a call to see if yours does. If you don't know where your local health department is, you can check in your phone book. The CDC also has a tool to help you locate your state's health department, and many state websites also have listings of all of their local health departments.  3) Find a Walk-in Clinic If your illness is not likely to be very severe or complicated, you may want to try a walk in clinic. These are popping up all over the country in pharmacies, drugstores, and shopping centers. They're usually staffed by nurse practitioners or physician assistants that have been trained to treat common illnesses and complaints.  They're usually fairly quick and inexpensive. However, if you have serious medical issues or chronic medical problems, these are probably not your best option.  No Primary Care Doctor: - Call Health Connect at  [phone] - they can help you locate a primary care doctor that  accepts your insurance, provides certain services, etc. - Physician Referral Service- [phone]  Chronic Pain Problems: Organization         Address  Phone   Notes  [facility]  [phone] Patients need to be referred by their primary care doctor.   Medication Assistance: Organization         Address  Phone   Notes  [facility] [address] [place], [place] [phone] --Must be a resident of [facility] -- Must have NO insurance coverage whatsoever (no Medicaid/ Medicare, etc.) -- The pt. MUST have a primary care doctor that directs their care regularly and follows them in the community  [facility]ist  [phone]   [employer]  [phone]    Agencies that provide inexpensive medical care: Organization         Address  Phone   Notes  [facility]  [phone]   [facility]    [phone]   [facility] [address] [place], [place] [address] [phone]   [facility] [address] [place] [place] [phone]   Planned Parenthood    [phone]   [facility]    [phone]   [facility]  [address], [place] Phone:  [phone], Fax:  [phone] Hours of Operation:  9 am - 6 pm, M-F.  Also accepts Medicaid/Medicare and self-pay.  [facility]  [address], [place] Phone: [phone], Fax: [phone]. Hours of Operation:  8:30 am - 5:30 pm, M-F.  Also accepts Medicaid and self-pay.  [facility] [place] [address] [place]  Phone: [phone]   [facility] [address] Natasha Bence [place], [place] [phone], Ext. [phone] Mondays & Thursdays: 7-9 AM.  First 15 patients are seen on a first come, first serve basis.    Medicaid-accepting [facility] Providers:  Organization         Address  Phone   Notes  [facility] [address], [place] [phone] Also accepts self-pay patients.  [facility] [address] Laurell Josephs [place], [place]  [phone]   [facility] [address], [place] [phone]   [facility] [address], [place] [phone]   Renaye Rakers [address], [place]   [phone] Only accepts [place] patients after they have their name applied to their card.   Self-Pay (no insurance) in [facility]:  Organization         Address  Phone   Notes  Sickle Cell Patients, [facility] [address] [place], [place] [phone]   [facility] [address] [place], [place] [phone]   [facility] [place]  [address], [place] [phone]   [facility]/Dr. Osei-Bonsu  [address] [place] or [address], [place] [phone] Phone number for both [place] and [place] locations is the same.  [facility] and [facility] [address], [place] [phone]   [facility] [address] [place] or [address] [phone]   [facility] [address], [place] [phone], phone; [phone], fax Sees patients 1st and 3rd Saturday of every month.  Must not qualify for public or private insurance (i.e. Medicaid, Medicare, [place] Health Choice, Veterans' Benefits)  Household income should be no more than 200% of the poverty level The clinic cannot  treat you if you are pregnant or think you are pregnant  Sexually transmitted diseases are not treated at the clinic.    Dental Care: Organization         Address  Phone  Notes  [facility] of [facility] [address] [place], [place] [phone] Accepts children up to age 49 who are enrolled in [place] or  Health Choice;  pregnant women with a Medicaid card; and children who have applied for Medicaid or [place] Health Choice, but were declined, whose parents can pay a reduced fee at time of service.  [facility] of [facility] [place]  [address], [place] [phone] Accepts children up to age 58 who are enrolled in [place] or [place] Health Choice; pregnant women with a Medicaid card; and children who have applied for Medicaid or [place] Health Choice, but were declined, whose parents can pay a reduced fee at time of service.  [facility]  [address], [place] [phone] Patients are seen by appointment only. Walk-ins are not accepted. [facility] will see patients 58 years of age and older. Monday - Tuesday (8am-5pm) Most Wednesdays (8:30-5pm) $30 per visit, cash only  [facility]  [address], [facility] [place] [phone] Patients are seen by appointment only. Walk-ins are not accepted. [facility] will see patients 58 years of age and older. One Wednesday Evening (Monthly: Volunteer Based).  $30 per visit, cash only  [facility] [facility]  [phone] for adults; Children under age 644, call Graduate Pediatric Dentistry at [phone]. Children aged 544-14, please call [phone] to request a pediatric application.  Dental services are provided in all areas of dental care including fillings, crowns and bridges, complete and partial dentures, implants, gum treatment, root canals, and extractions. Preventive care is also provided.  Treatment is provided to both adults and children. Patients are selected via a lottery and there is often a waiting list.   [facility] [address], [place]  [phone] [email or web address]   [facility] [address], [place], [place] [phone], Ext. [phone] Second and Fourth Thursday of each month, opens at 6:30 AM; Clinic ends at 9 AM.  Patients are seen on a first-come first-served basis, and a limited number are seen during each clinic.   [facility]  [address], [place], [place] [phone]   Eligibility Requirements You must have lived in [place], [place], or [place] counties for at least the last three months.   You cannot be eligible for state or federal sponsored [place] Cityhealthcare insurance, including CIGNAVeterans Administration, [place]icaid, or [employer] EntertainmentMedicare.   You generally cannot be eligible for healthcare insurance through your employer.    How to apply: Eligibility screenings are held every Tuesday and Wednesday afternoon from 1:00 pm until 4:00 pm. You do not need an appointment for the interview!  [facility] [address], [place], [place] [phone]   [facility]  [phone]   [facility]  [phone]   [facility]  [phone]    Behavioral Health Resources in the Community: Intensive Outpatient Programs Organization         Address  Phone  Notes  [facility] [address], [place], [place] [phone]   [facility] [address], [place], [place] [phone]   [facility] [address], [place], [place]  [phone]   [facility] [address],  [place], [place] [phone] or [phone]   Substance Abuse Resources Organization         Address  Phone  Notes  [facility]  [phone]   [facility]   [phone]   [facility]  [phone]  Floydene Flock  [phone]   Residential & Outpatient Substance Abuse Program  [phone]   Psychological Services Organization         Address  Phone  Notes  [facility]  [phone]   [facility]  [phone]   [facility] [address] [place] [phone] or [phone]    Mobile Crisis Teams Organization         Address  Phone  Notes  [facility], Mobile Crisis Care Unit  [phone]   [facility]  [address] [place], [place] [phone]   Doristine Locks [address] [place] [phone]    Self-Help/Support Groups Organization         Address  Phone             Notes  [facility] of [place] - variety of support groups  336- I7437963 Call for more information  Narcotics Anonymous (NA), [facility] [address], [employer] [place]  2 meetings at this location   Statistician         Address  Phone  Notes  [facility] [address],    [place]  [phone]   [facility]  [address], [place] [address], [place], [place] [phone]   [facility] [address] [place], [place] [phone] Admissions: 8am-3pm M-F  [facility] [address]    [place], [place] [phone]   [facility] [address] [place], [place], [place] [phone]   [facility] [address]  [place], [place] [phone]   [facility] - Intensive Outpatient [address] Laurell Josephs [address], [place], [place] [phone]   [facility] ([facility] [address] [place].,  [place], [place] [phone] or [phone]   [facility] ([facility]) [address] [place], [place] [phone] Accepts Medicaid  [facility] [address]  [place] [phone] Substance  Abuse/Addiction Treatment   [facility] Organization         Address  Phone  Notes  [facility]  [phone]   Angie Fava, PhD [address] Ervin Knack [place], [place]   [phone] or [phone]   [facility] Behavioral   [address] [place], [place] [phone]   [facility] [address] [place], [place] [phone] Insurance/Medicaid/sponsorship through [facility] and Families [address]                                    [place], [place] [phone] Therapy/tele-psych/case  [facility] [address], [place] [phone]    Dr. Lolly Mustache  [phone]   [facility] [place] [facility]  [employer] [facility] Dept. 1) [address]  2) [address] [place] 3)  [address], [place] [phone]  [phone]   [facility] Child Abuse Hotline (984)683-2409 or [phone] (After Hours)

## 2014-02-17 NOTE — ED Notes (Signed)
Assumed care of patient Patient seen in this ED on 6/11 for c/o chronic back pain and need of narcotic pain medication Patient given Rx for Oxycodone at time of discharge Patient here today for Rx and pain management  Patient ambulatory from triage with use of cane--at baseline and ambulates without difficulty

## 2014-02-17 NOTE — ED Notes (Signed)
PA at bedside.

## 2014-02-17 NOTE — ED Notes (Signed)
Patient c/o chronic back pain, states he has been taking oxycodone for 8 years and he ran out on Monday. Patient states in the past his PCP was prescribing his Oxy, but he has since started seeing a new PCP who does not prescribe narcotics. Patient is hoping to get in with pain mgmt specialist.

## 2014-02-19 NOTE — ED Provider Notes (Signed)
Medical screening examination/treatment/procedure(s) were performed by non-physician practitioner and as supervising physician I was immediately available for consultation/collaboration.   EKG Interpretation None        Erikah Thumm, MD 02/19/14 2342 

## 2014-02-25 ENCOUNTER — Encounter: Payer: Self-pay | Admitting: Physical Medicine & Rehabilitation

## 2014-04-30 ENCOUNTER — Ambulatory Visit (HOSPITAL_BASED_OUTPATIENT_CLINIC_OR_DEPARTMENT_OTHER): Payer: Self-pay | Admitting: Physical Medicine & Rehabilitation

## 2014-04-30 ENCOUNTER — Encounter: Payer: Self-pay | Admitting: Physical Medicine & Rehabilitation

## 2014-04-30 ENCOUNTER — Encounter: Payer: Self-pay | Attending: Physical Medicine & Rehabilitation

## 2014-04-30 VITALS — BP 127/85 | HR 95 | Resp 14 | Ht 65.5 in | Wt 164.8 lb

## 2014-04-30 DIAGNOSIS — M5137 Other intervertebral disc degeneration, lumbosacral region: Secondary | ICD-10-CM | POA: Insufficient documentation

## 2014-04-30 DIAGNOSIS — Z5181 Encounter for therapeutic drug level monitoring: Secondary | ICD-10-CM

## 2014-04-30 DIAGNOSIS — M549 Dorsalgia, unspecified: Secondary | ICD-10-CM

## 2014-04-30 DIAGNOSIS — M4802 Spinal stenosis, cervical region: Secondary | ICD-10-CM | POA: Insufficient documentation

## 2014-04-30 DIAGNOSIS — G8929 Other chronic pain: Secondary | ICD-10-CM | POA: Insufficient documentation

## 2014-04-30 DIAGNOSIS — Z79899 Other long term (current) drug therapy: Secondary | ICD-10-CM | POA: Insufficient documentation

## 2014-04-30 MED ORDER — TRAMADOL HCL 50 MG PO TABS: 50.0000 mg | ORAL_TABLET | Freq: Four times a day (QID) | ORAL | Status: AC

## 2014-04-30 NOTE — Patient Instructions (Signed)
Back Exercises These exercises may help you when beginning to rehabilitate your injury. Your symptoms may resolve with or without further involvement from your physician, physical therapist or athletic trainer. While completing these exercises, remember:   Restoring tissue flexibility helps normal motion to return to the joints. This allows healthier, less painful movement and activity.  An effective stretch should be held for at least 30 seconds.  A stretch should never be painful. You should only feel a gentle lengthening or release in the stretched tissue. STRETCH - Extension, Prone on Elbows   Lie on your stomach on the floor, a bed will be too soft. Place your palms about shoulder width apart and at the height of your head.  Place your elbows under your shoulders. If this is too painful, stack pillows under your chest.  Allow your body to relax so that your hips drop lower and make contact more completely with the floor.  Hold this position for __________ seconds.  Slowly return to lying flat on the floor. Repeat __________ times. Complete this exercise __________ times per day.  RANGE OF MOTION - Extension, Prone Press Ups   Lie on your stomach on the floor, a bed will be too soft. Place your palms about shoulder width apart and at the height of your head.  Keeping your back as relaxed as possible, slowly straighten your elbows while keeping your hips on the floor. You may adjust the placement of your hands to maximize your comfort. As you gain motion, your hands will come more underneath your shoulders.  Hold this position __________ seconds.  Slowly return to lying flat on the floor. Repeat __________ times. Complete this exercise __________ times per day.  RANGE OF MOTION- Quadruped, Neutral Spine   Assume a hands and knees position on a firm surface. Keep your hands under your shoulders and your knees under your hips. You may place padding under your knees for  comfort.  Drop your head and point your tail bone toward the ground below you. This will round out your low back like an angry cat. Hold this position for __________ seconds.  Slowly lift your head and release your tail bone so that your back sags into a large arch, like an old horse.  Hold this position for __________ seconds.  Repeat this until you feel limber in your low back.  Now, find your "sweet spot." This will be the most comfortable position somewhere between the two previous positions. This is your neutral spine. Once you have found this position, tense your stomach muscles to support your low back.  Hold this position for __________ seconds. Repeat __________ times. Complete this exercise __________ times per day.  STRETCH - Flexion, Single Knee to Chest   Lie on a firm bed or floor with both legs extended in front of you.  Keeping one leg in contact with the floor, bring your opposite knee to your chest. Hold your leg in place by either grabbing behind your thigh or at your knee.  Pull until you feel a gentle stretch in your low back. Hold __________ seconds.  Slowly release your grasp and repeat the exercise with the opposite side. Repeat __________ times. Complete this exercise __________ times per day.  STRETCH - Hamstrings, Standing  Stand or sit and extend your right / left leg, placing your foot on a chair or foot stool  Keeping a slight arch in your low back and your hips straight forward.  Lead with your chest and   lean forward at the waist until you feel a gentle stretch in the back of your right / left knee or thigh. (When done correctly, this exercise requires leaning only a small distance.)  Hold this position for __________ seconds. Repeat __________ times. Complete this stretch __________ times per day. STRENGTHENING - Deep Abdominals, Pelvic Tilt   Lie on a firm bed or floor. Keeping your legs in front of you, bend your knees so they are both pointed  toward the ceiling and your feet are flat on the floor.  Tense your lower abdominal muscles to press your low back into the floor. This motion will rotate your pelvis so that your tail bone is scooping upwards rather than pointing at your feet or into the floor.  With a gentle tension and even breathing, hold this position for __________ seconds. Repeat __________ times. Complete this exercise __________ times per day.  STRENGTHENING - Abdominals, Crunches   Lie on a firm bed or floor. Keeping your legs in front of you, bend your knees so they are both pointed toward the ceiling and your feet are flat on the floor. Cross your arms over your chest.  Slightly tip your chin down without bending your neck.  Tense your abdominals and slowly lift your trunk high enough to just clear your shoulder blades. Lifting higher can put excessive stress on the low back and does not further strengthen your abdominal muscles.  Control your return to the starting position. Repeat __________ times. Complete this exercise __________ times per day.  STRENGTHENING - Quadruped, Opposite UE/LE Lift   Assume a hands and knees position on a firm surface. Keep your hands under your shoulders and your knees under your hips. You may place padding under your knees for comfort.  Find your neutral spine and gently tense your abdominal muscles so that you can maintain this position. Your shoulders and hips should form a rectangle that is parallel with the floor and is not twisted.  Keeping your trunk steady, lift your right hand no higher than your shoulder and then your left leg no higher than your hip. Make sure you are not holding your breath. Hold this position __________ seconds.  Continuing to keep your abdominal muscles tense and your back steady, slowly return to your starting position. Repeat with the opposite arm and leg. Repeat __________ times. Complete this exercise __________ times per day. Document Released:  09/07/2005 Document Revised: 11/12/2011 Document Reviewed: 12/02/2008 [employer] Patient Information 2015 [employer]. This information is not intended to replace advice given to you by your health care provider. Make sure you discuss any questions you have with your health care provider.  

## 2014-04-30 NOTE — Progress Notes (Signed)
Subjective:    Patient ID: Randall Kline, male    DOB: 29-Aug-1956, 58 y.o.   MRN: [id number]  HPI Chief complaint: Back pain and neck pain Onset of pain 2005, starting with the low back and then later developing neck pain. No apparent injury. Head MRIs in 2006 as well as 2008. The MRI report from 2008 showed left posterior lateral disc bulge with annular tear at L3-L4 on the cervical MRI dated 01/13/2007 disc degeneration and arthritic changes with moderate right C6 foraminal stenosis. Other levels had only mild changes at L3-4 stress at C3-4 and C4-5 remainder of levels were normal.  No arm pain or leg pain. Went to physical therapy, had exercise as well as TENS unit, patient had been doing home exercise program up until 2013/11/26when his son died. Since that time he is not kept up with this.  He was taking Flexeril 5 mg and naproxen 500 mg after oxycodone 10 mg ran out was on oxycodone 2007-2015,  Oxycontin , hydrocodone didn't work Was on pain management at [facility], cost became an issue D/Ced from prior PCP for [facility], states he no longer smokes it Did not try tramadol   Pain Inventory Average Pain 7 Pain Right Now 6 My pain is sharp  In the last 24 hours, has pain interfered with the following? General activity 7 Relation with others 9 Enjoyment of life 9 What TIME of day is your pain at its worst? daytime and evening Sleep (in general) Poor  Pain is worse with: bending, sitting, standing and some activites Pain improves with: medication Relief from Meds: 8  Mobility use a cane how many minutes can you walk? 10-15 ability to climb steps?  no do you drive?  yes  Function not employed: date last employed 1996 I need assistance with the following:  household duties  Neuro/Psych weakness trouble walking spasms  Prior Studies Any changes since last visit?  no  Physicians involved in your care Any changes since last visit?  no   Family History  Problem  Relation Age of Onset  . Kidney disease Father    History   Social History  . Marital Status: Married    Spouse Name: N/A    Number of Children: N/A  . Years of Education: N/A   Social History Main Topics  . Smoking status: Current Every Day Smoker -- 0.50 packs/day    Types: Cigarettes  . Smokeless tobacco: None  . Alcohol Use: No  . Drug Use: No  . Sexual Activity: None   Other Topics Concern  . None   Social History Narrative  . None   Past Surgical History  Procedure Laterality Date  . Hernia repair    . Wisdom tooth extraction    . Cataract extraction Bilateral    Past Medical History  Diagnosis Date  . Thyroid disease   . DDD (degenerative disc disease)   . Detached retina, left    BP 127/85  Pulse 95  Resp 14  Ht 5' 5.5" (1.664 m)  Wt 164 lb 12.8 oz (74.753 kg)  BMI 27.00 kg/m2  SpO2 96%  Opioid Risk Score:   Fall Risk Score: Low Fall Risk (0-5 points) (educated and given handout for fall prevention in the home) Review of Systems  Respiratory: Positive for cough, shortness of breath and wheezing.   Musculoskeletal: Positive for back pain and gait problem.       Spasms  Neurological: Positive for weakness.  All  other systems reviewed and are negative.      Objective:   Physical Exam  Constitutional: He is oriented to person, place, and time.  Neurological: He is alert and oriented to person, place, and time. He displays no atrophy and no tremor. He exhibits normal muscle tone.  Reflex Scores:      Tricep reflexes are 3+ on the right side and 3+ on the left side.      Bicep reflexes are 2+ on the right side and 2+ on the left side.      Brachioradialis reflexes are 2+ on the right side and 2+ on the left side.      Patellar reflexes are 2+ on the right side and 2+ on the left side.      Achilles reflexes are 1+ on the right side and 1+ on the left side. Sensation to pinprick intact bilateral C5 C6-C7 C8 L2-L3 L4-S1 dermatomes  distribution  Motor strength is 5/5 bilateral deltoid, bicep, tricep, grip, hip flexor, knee extension, ankle dorsiflexion plantar flexed    General no acute distress Min affect appropriate Lumbar spine range of motion; percent for flexion and extension 50% lateral bending and rotation Ambulation without evidence of toe drag her knee stability. Uses a cane.  Cervical range of motion flexion 100%, extension 75%, right rotation 75% left rotation 50% Left lateral bending 100% right lateral bending 75%. Pain with left lateral bending         Assessment & Plan:  1. Cervical spondylosis with disc osteophytes complex at C5-C6 no signs of radiculopathy. Has chronic neck pain. 2. Lumbar degenerative disc primary at L3-L4 no signs of radiculopathy. 3. Chronic pain syndrome the patient has completed PT OT in the past. He no longer has insurance coverage for therapy. Will print out home exercise program.  Discussed medication management. Has had descend relief with oxycodone. But is currently not taking this period in addition he has not tried tramadol or Tylenol #3. He has financial difficulties and therefore an agent that requires less strict monitoring would be preferable.  We'll check urine drug screen in the event we move on to a class III or class II med  Patient has not had good relief with nonsteroidal anti-inflammatory or with Flexeril will not add any secondary ages however we may consider a different nonsteroidal or muscle relaxer if needed.  RTC in 6 months assuming he gets good relief from the above regimen of tramadol 50 mg 4 times a day if not will need to reassess return to clinic in another month or 2

## 2014-06-29 ENCOUNTER — Telehealth: Payer: Self-pay | Admitting: *Deleted

## 2014-06-29 NOTE — Telephone Encounter (Signed)
Patient will need to make an appointment for next available with myself or Riley Lamunice to discuss other options

## 2014-06-29 NOTE — Telephone Encounter (Signed)
Patient called and left message that the RX for Tramadol 50 mg QID is not working. Marland Kitchen.   Next appt is not until 10/29/2014.  Should he be scheduled to discuss medications.  Please advise

## 2014-09-30 ENCOUNTER — Encounter: Payer: Self-pay | Admitting: Physical Medicine & Rehabilitation

## 2014-10-29 ENCOUNTER — Ambulatory Visit: Payer: Self-pay | Admitting: Physical Medicine & Rehabilitation

## 2017-08-03 DEATH — deceased
